# Patient Record
Sex: Female | Born: 1945 | Race: Black or African American | Hispanic: No | Marital: Single
Health system: Southern US, Community
[De-identification: ages and names within clinical notes are randomized; demographics above are authoritative.]

---

## 2016-07-18 ENCOUNTER — Emergency Department (HOSPITAL_COMMUNITY): Payer: Medicare Other

## 2016-07-18 ENCOUNTER — Emergency Department (HOSPITAL_COMMUNITY)
Admission: EM | Admit: 2016-07-18 | Discharge: 2016-07-19 | Disposition: A | Discharge status: Home / Self Care | Payer: Medicare Other | Attending: Emergency Medicine | Admitting: Emergency Medicine

## 2016-07-18 ENCOUNTER — Other Ambulatory Visit: Payer: Self-pay

## 2016-07-18 DIAGNOSIS — R0789 Other chest pain: Secondary | ICD-10-CM | POA: Insufficient documentation

## 2016-07-18 DIAGNOSIS — R079 Chest pain, unspecified: Secondary | ICD-10-CM | POA: Diagnosis present

## 2016-07-18 LAB — BASIC METABOLIC PANEL
Anion gap: 7 (ref 5–15)
BUN: 19 mg/dL (ref 6–20)
CHLORIDE: 103 mmol/L (ref 101–111)
CO2: 26 mmol/L (ref 22–32)
CREATININE: 0.85 mg/dL (ref 0.44–1.00)
Calcium: 9.6 mg/dL (ref 8.9–10.3)
GFR calc Af Amer: >60 mL/min (ref >60)
GFR calc non Af Amer: >60 mL/min (ref >60)
GLUCOSE: 94 mg/dL (ref 65–99)
POTASSIUM: 4.3 mmol/L (ref 3.5–5.1)
Sodium: 136 mmol/L (ref 135–145)

## 2016-07-18 LAB — CBC
HCT: 37.9 % (ref 36.0–46.0)
Hemoglobin: 12.5 g/dL (ref 12.0–15.0)
MCH: 32 pg (ref 26.0–34.0)
MCHC: 33 g/dL (ref 30.0–36.0)
MCV: 96.9 fL (ref 78.0–100.0)
PLATELETS: 252 10*3/uL (ref 150–400)
RBC: 3.91 MIL/uL (ref 3.87–5.11)
RDW: 12.3 % (ref 11.5–15.5)
WBC: 8.9 10*3/uL (ref 4.0–10.5)

## 2016-07-18 LAB — I-STAT TROPONIN, ED: Troponin i, poc: 0 ng/mL (ref 0.00–0.08)

## 2016-07-18 MED ORDER — IBUPROFEN 800 MG PO TABS
800.0000 mg | ORAL_TABLET | Freq: Once | ORAL | Status: AC
  Administered 2016-07-19: 800 mg via ORAL
  Filled 2016-07-18: qty 1

## 2016-07-18 NOTE — ED Notes (Signed)
At the desk wanting to know how much longer.  Explained she had a room and someone would be out to get her momentarily.

## 2016-07-18 NOTE — ED Triage Notes (Signed)
Pt c/o chronic cough since 2012 with evals for this complaint, pt here today for worsening pain onset yesterday worse, pt reports SOB, pt denies n/v/d, pt A&O x4

## 2016-07-18 NOTE — ED Provider Notes (Signed)
[facility]-EMERGENCY DEPT Provider Note   CSN: [id number] Arrival date & time: 07/18/16  1803   By signing my name below, I, Freida Busman, attest that this documentation has been prepared under the direction and in the presence of Cielo Arias, Jeannett Senior, MD . Electronically Signed: Freida Busman, Scribe. 07/18/2016. 11:49 PM.  History   Chief Complaint Chief Complaint  Patient presents with  . Chest Pain     The history is provided by the patient. No language interpreter was used.     HPI Comments:  Ashlee Shepard is a 71 y.o. female with h/o HTN, who presents to the Emergency Department complaining of intermittent, right sided CP that began last night. The pain radiates into her back and each episode lasts for a few seconds at a time. She has taken Tylenol with mild relief. No SOB or exacerbation of pain with inspiration. She has been in PT in the past for similar back pain. No personal h/o cardiac issues or blood clots. No bowel/bladder issues. No appetite change. Pt notes chronic cough x 6 years and is followed by pulmonolgy. She states she flew to [place] from [place] 10 days ago.   No past medical history on file.  There are no active problems to display for this patient.   No past surgical history on file.  OB History    No data available       Home Medications    Prior to Admission medications   Not on File    Family History No family history on file.  Social History Social History  Substance Use Topics  . Smoking status: Not on file  . Smokeless tobacco: Not on file  . Alcohol use Not on file     Allergies   Patient has no known allergies.   Review of Systems Review of Systems All systems reviewed and are negative for acute change except as noted in the HPI.   Physical Exam Updated Vital Signs BP (!) 133/55 (BP Location: Left Arm)   Pulse 62   Temp 97.5 F (36.4 C) (Oral)   Resp 18   Ht 5\' 1"  (1.549 m)   Wt 220 lb (99.8 kg)   SpO2 100%   BMI 41.57 kg/m    Physical Exam  Constitutional: She is oriented to person, place, and time. She appears well-developed and well-nourished. No distress.  HENT:  Head: Normocephalic and atraumatic.  Mouth/Throat: Oropharynx is clear and moist. No oropharyngeal exudate.  Eyes: Conjunctivae and EOM are normal. Pupils are equal, round, and reactive to light.  Neck: Normal range of motion. Neck supple.  No meningismus.  Cardiovascular: Normal rate, regular rhythm, normal heart sounds and intact distal pulses.   No murmur heard. Pulmonary/Chest: Effort normal and breath sounds normal. No respiratory distress. She exhibits tenderness.  Reproducible right chest wall tenderness   Abdominal: Soft. There is no tenderness. There is no rebound and no guarding.  Musculoskeletal: Normal range of motion. She exhibits tenderness. She exhibits no edema.  Paraspinal right thoracic tenderness   Neurological: She is alert and oriented to person, place, and time. No cranial nerve deficit. She exhibits normal muscle tone. Coordination normal.  No ataxia on finger to nose bilaterally. No pronator drift. 5/5 strength throughout. CN 2-12 intact.Equal grip strength. Sensation intact.   Skin: Skin is warm.  Psychiatric: She has a normal mood and affect. Her behavior is normal.  Nursing note and vitals reviewed.    ED Treatments / Results  DIAGNOSTIC STUDIES:  Oxygen Saturation  is 99% on RA, normal by my interpretation.    COORDINATION OF CARE:  11:55 PM Discussed treatment plan with pt at bedside and pt agreed to plan.  Labs (all labs ordered are listed, but only abnormal results are displayed) Labs Reviewed  BASIC METABOLIC PANEL  CBC  D-DIMER, QUANTITATIVE (NOT AT [facility])  I-STAT TROPOININ, ED  I-STAT TROPOININ, ED    EKG  EKG Interpretation  Date/Time:  Friday July 18 2016 18:05:42 EDT Ventricular Rate:  58 PR Interval:  144 QRS Duration: 88 QT Interval:  416 QTC Calculation: 408 R Axis:   47 Text  Interpretation:  Sinus bradycardia Otherwise normal ECG No previous ECGs available Confirmed by Glynn Octaveancour, Sharol Croghan [phone]([id number]) on 07/18/2016 11:24:38 PM       Radiology Dg Chest 2 View  Result Date: 07/18/2016 CLINICAL DATA:  [age] year old female with chest pain for 1 day. Cough for 3 months. EXAM: CHEST  2 VIEW COMPARISON:  None. FINDINGS: The cardiomediastinal silhouette is unremarkable. There is no evidence of focal airspace disease, pulmonary edema, suspicious pulmonary nodule/mass, pleural effusion, or pneumothorax. No acute bony abnormalities are identified. IMPRESSION: No active cardiopulmonary disease. Electronically Signed   By: Harmon PierJeffrey  Hu M.D.   On: 07/18/2016 19:09    Procedures Procedures (including critical care time)  Medications Ordered in ED Medications - No data to display   Initial Impression / Assessment and Plan / ED Course  I have reviewed the triage vital signs and the nursing notes.  Pertinent labs & imaging results that were available during my care of the patient were reviewed by me and considered in my medical decision making (see chart for details).     Patient presents with central chest pain and back pain has been intermittent since yesterday. Worse with palpation and movement. She is visiting from [place] with recent air travel.  EKG normal sinus rhythm. Chest pain is reproducible to palpation.  Equal upper extremity blood pressures. Doubt aortic dissection. D-dimer negative. Doubt PE. Troponin negative 2. Doubt ACS.  Discussed with patient this is likely musculoskeletal chest pain. Follow-up with her doctor. May want to discuss stopping valsartan as maybe contributing to her chronic cough.  2:02 AM Pt updated with results. Will discharge home.  Return precautions discussed.  Final Clinical Impressions(s) / ED Diagnoses   Final diagnoses:  Atypical chest pain    New Prescriptions New Prescriptions   No medications on file   I personally  performed the services described in this documentation, which was scribed in my presence. The recorded information has been reviewed and is accurate.     Glynn Octaveancour, Shantia Sanford, MD 07/19/16 [phone]

## 2016-07-18 NOTE — ED Notes (Signed)
Text sent by [employer] that a room has opened up and someone would be out shortly.

## 2016-07-19 LAB — I-STAT TROPONIN, ED: Troponin i, poc: 0 ng/mL (ref 0.00–0.08)

## 2016-07-19 LAB — D-DIMER, QUANTITATIVE (NOT AT ARMC): D DIMER QUANT: 0.41 ug{FEU}/mL (ref 0.00–0.50)

## 2016-07-19 MED ORDER — IBUPROFEN 800 MG PO TABS: 800.0000 mg | ORAL_TABLET | Freq: Three times a day (TID) | ORAL | 0 refills | Status: AC

## 2016-07-19 NOTE — ED Notes (Signed)
Patient verbalized understanding of discharge instructions and denies any further needs or questions at this time. VS stable. Patient ambulatory with steady gait, declined wheelchair. RN escorted to ED entrance.   

## 2016-07-19 NOTE — Discharge Instructions (Signed)
There is no evidence of heart attack or blood clot in the lung. Follow up with your doctor. Return to the ED if you develop new or worsening symptoms.  °

## 2018-06-19 IMAGING — CR DG CHEST 2V
2 series · 2 of 2 positions shown · non-contrast
Comparison: None.

CLINICAL DATA: 71-year-old female with chest pain for 1 day. Cough
for 3 months.

EXAM:
CHEST  2 VIEW

[chest pa]
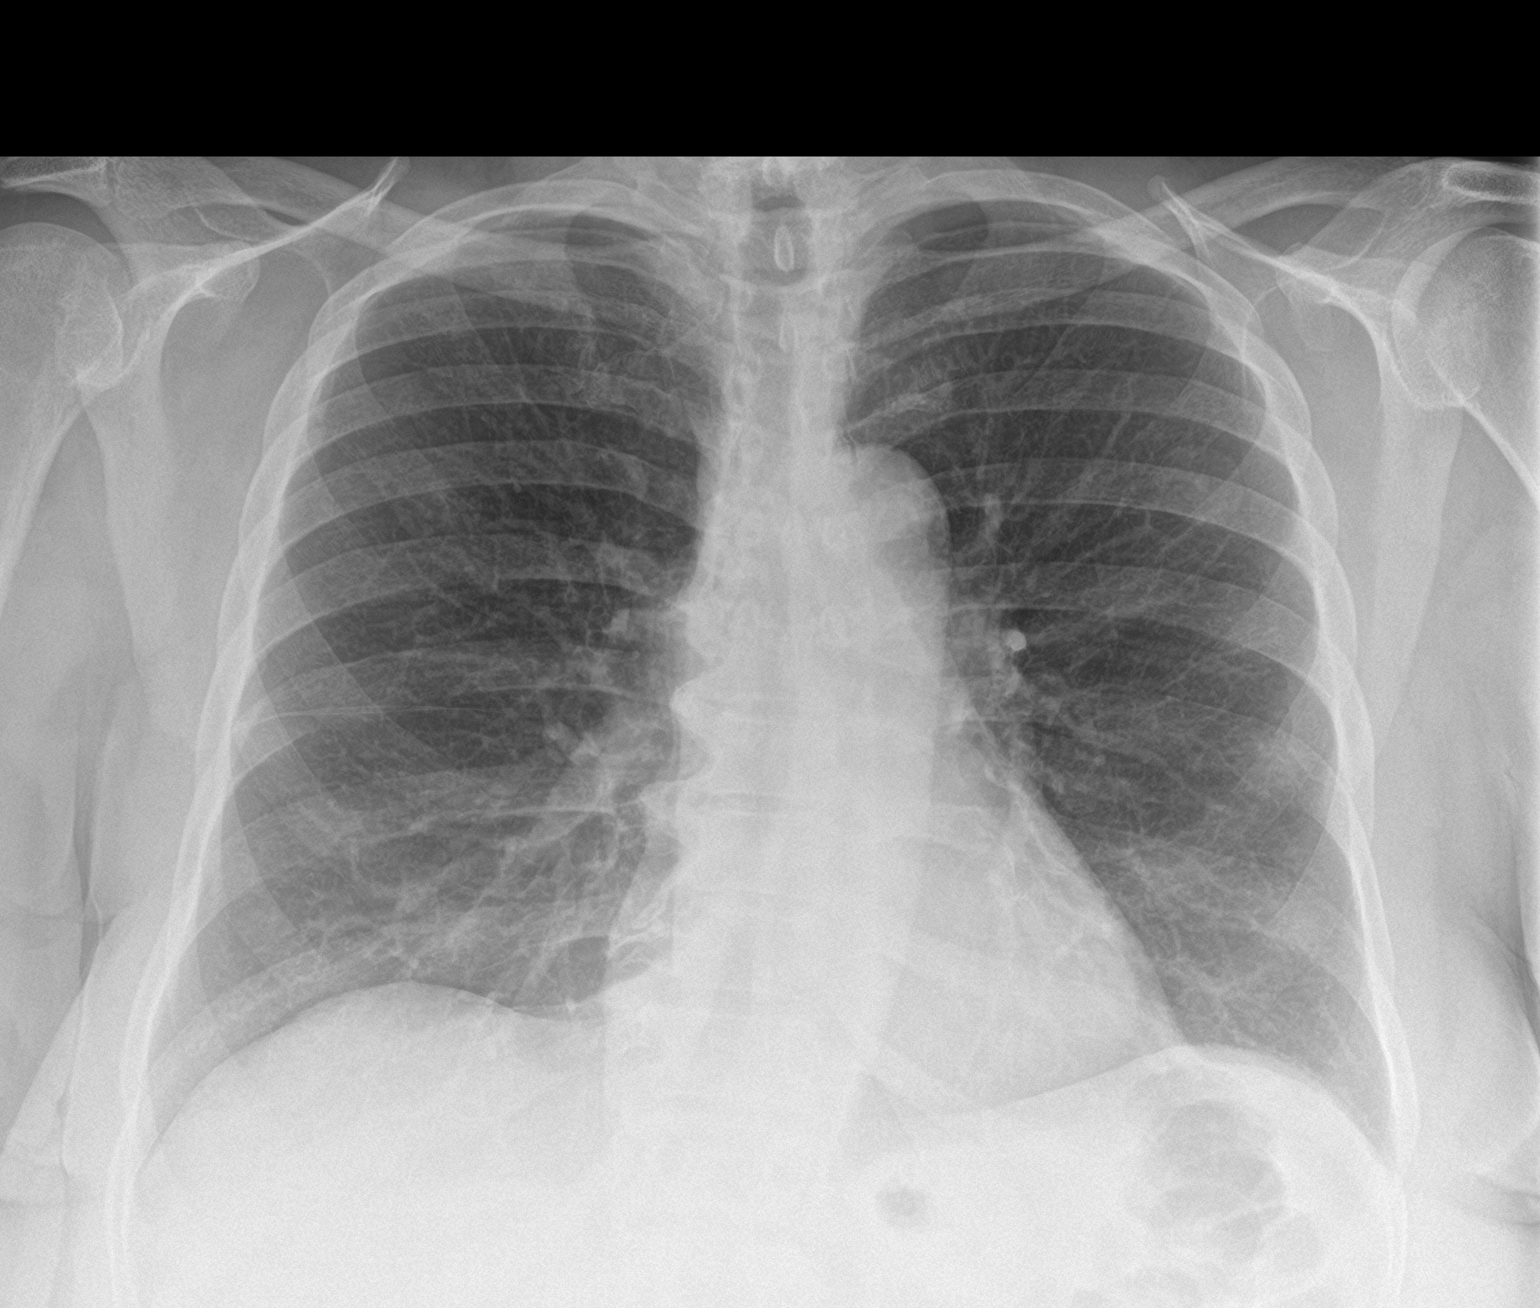

[chest lat]
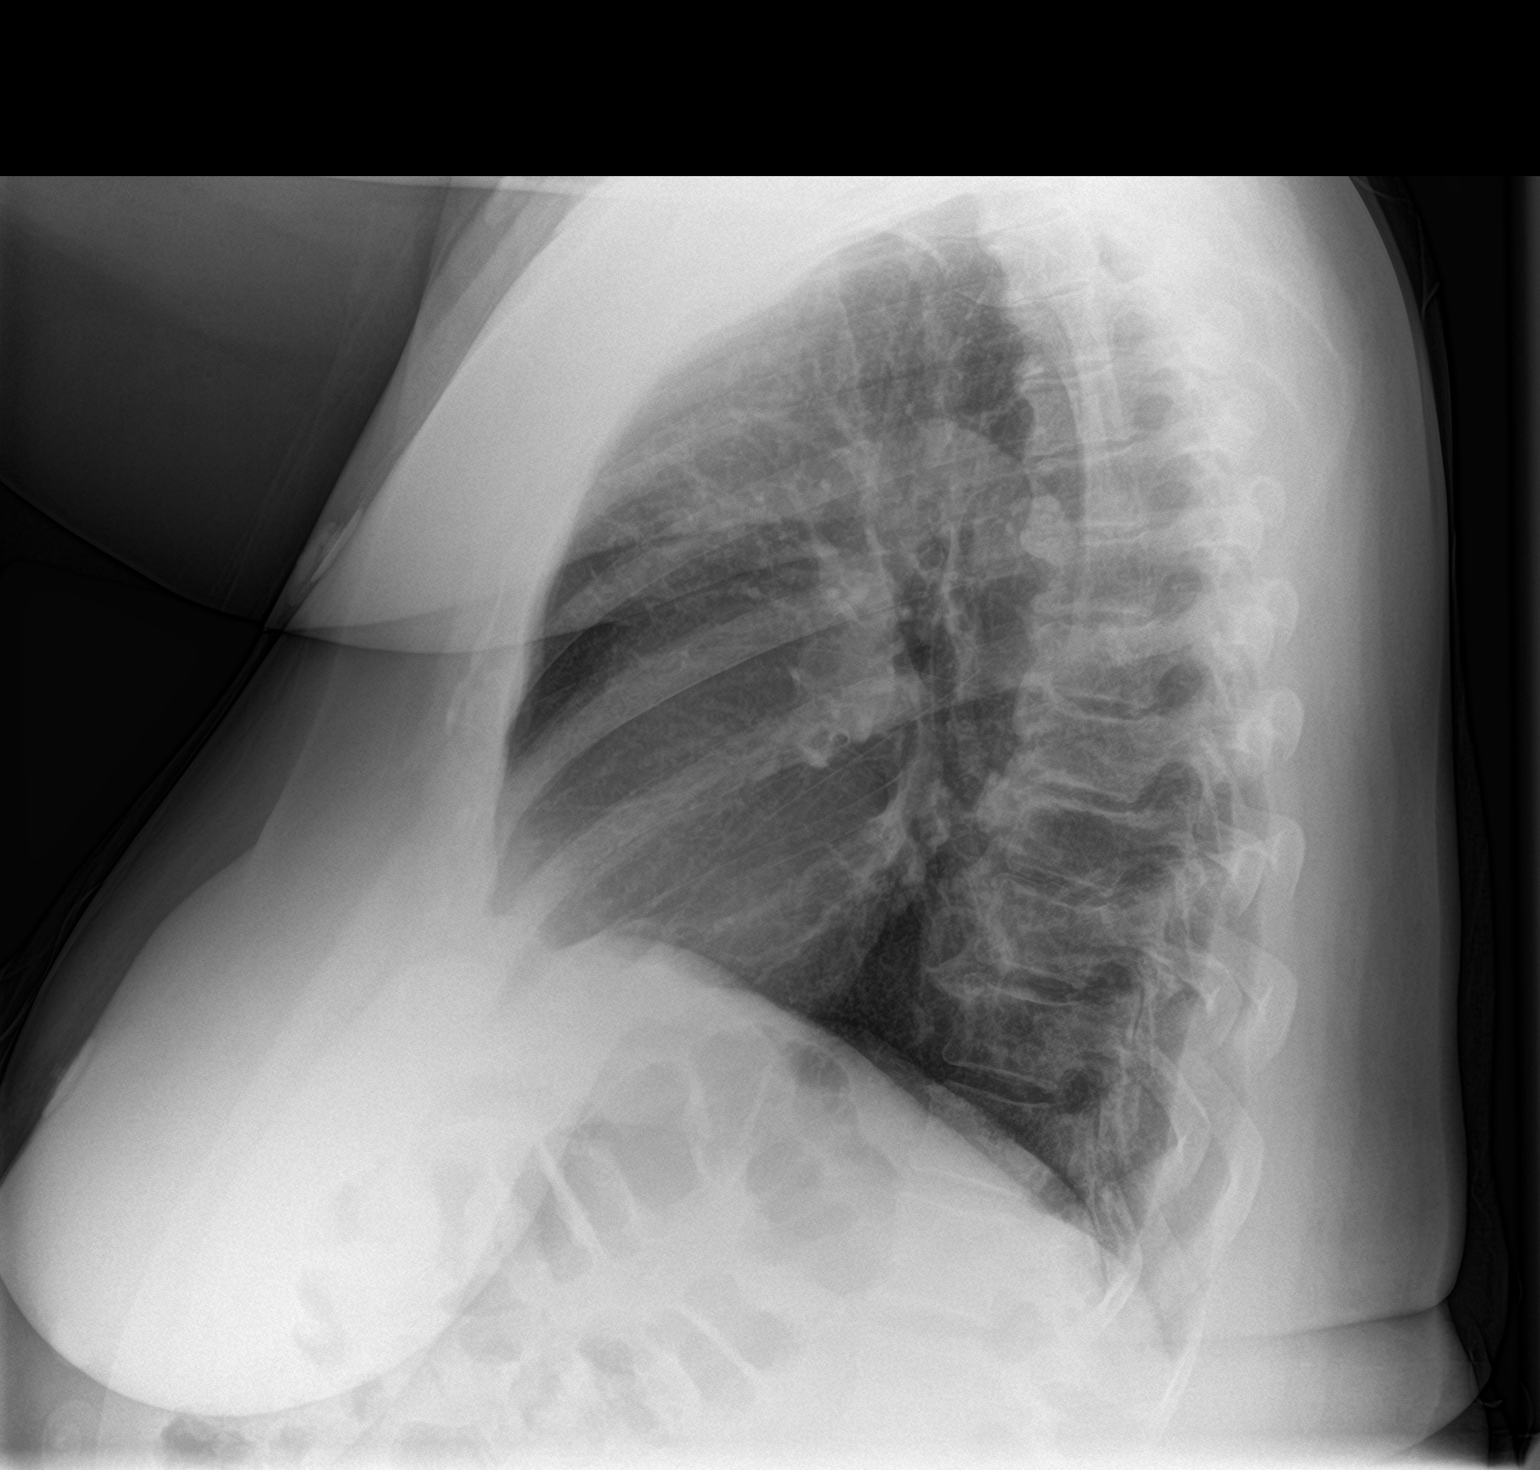

[2 of 2 positions shown; findings below may reference images not displayed]

FINDINGS: The cardiomediastinal silhouette is unremarkable.

There is no evidence of focal airspace disease, pulmonary edema,
suspicious pulmonary nodule/mass, pleural effusion, or pneumothorax.
No acute bony abnormalities are identified.
IMPRESSION: No active cardiopulmonary disease.
